# Patient Record
Sex: Male | Born: 1995 | Hispanic: Yes | Marital: Single | Smoking: Never smoker
Health system: Southern US, Community
[De-identification: ages and names within clinical notes are randomized; demographics above are authoritative.]

## PROBLEM LIST (undated history)

## (undated) DIAGNOSIS — J302 Other seasonal allergic rhinitis: Secondary | ICD-10-CM

---

## 2019-01-06 ENCOUNTER — Other Ambulatory Visit: Payer: Self-pay

## 2019-01-06 ENCOUNTER — Emergency Department
Admission: EM | Admit: 2019-01-06 | Discharge: 2019-01-06 | Disposition: A | Discharge status: Home / Self Care | Payer: Self-pay | Attending: Emergency Medicine | Admitting: Emergency Medicine

## 2019-01-06 ENCOUNTER — Emergency Department: Payer: Self-pay

## 2019-01-06 DIAGNOSIS — R079 Chest pain, unspecified: Secondary | ICD-10-CM | POA: Insufficient documentation

## 2019-01-06 DIAGNOSIS — R0789 Other chest pain: Secondary | ICD-10-CM

## 2019-01-06 HISTORY — DX: Other seasonal allergic rhinitis: J30.2

## 2019-01-06 LAB — BASIC METABOLIC PANEL
Anion gap: 8 (ref 5–15)
BUN: 16 mg/dL (ref 6–20)
CO2: 27 mmol/L (ref 22–32)
Calcium: 9.4 mg/dL (ref 8.9–10.3)
Chloride: 104 mmol/L (ref 98–111)
Creatinine, Ser: 0.97 mg/dL (ref 0.61–1.24)
GFR calc Af Amer: >60 mL/min (ref >60)
GFR calc non Af Amer: >60 mL/min (ref >60)
Glucose, Bld: 101 mg/dL — ABNORMAL HIGH (ref 70–99)
Potassium: 4.6 mmol/L (ref 3.5–5.1)
Sodium: 139 mmol/L (ref 135–145)

## 2019-01-06 LAB — CBC
HCT: 47.3 % (ref 39.0–52.0)
Hemoglobin: 16.1 g/dL (ref 13.0–17.0)
MCH: 30.8 pg (ref 26.0–34.0)
MCHC: 34 g/dL (ref 30.0–36.0)
MCV: 90.6 fL (ref 80.0–100.0)
Platelets: 176 K/uL (ref 150–400)
RBC: 5.22 MIL/uL (ref 4.22–5.81)
RDW: 12.4 % (ref 11.5–15.5)
WBC: 4.2 K/uL (ref 4.0–10.5)
nRBC: 0 % (ref 0.0–0.2)

## 2019-01-06 LAB — TROPONIN I (HIGH SENSITIVITY): Troponin I (High Sensitivity): 8 ng/L (ref <18)

## 2019-01-06 MED ORDER — IBUPROFEN 800 MG PO TABS: 800.0000 mg | ORAL_TABLET | Freq: Three times a day (TID) | ORAL | 0 refills | Status: AC | PRN

## 2019-01-06 NOTE — ED Notes (Signed)
Pt states that last night around 2330, he began to experience centralized chest pain that have not subsided since. Pt states that he was laying in bed when the chest pain began.  States that when he isn't moving the pain is a dull pressure and with activity the pain becomes sharp.

## 2019-01-06 NOTE — ED Provider Notes (Signed)
[facility] Emergency Department Provider Note       Time seen: ----------------------------------------- 11:05 AM on 01/06/2019 -----------------------------------------   I have reviewed the triage vital signs and the nursing notes.  HISTORY   Chief Complaint Chest Pain    HPI Steven Whitaker is a 23 y.o. male with a history of seasonal allergies who presents to the ED for centralized chest pain that started last night with intermittent periods of sharp chest pain.  He denies any shortness of breath.  He has sharp pain with exertion.  He denies fevers, chills or other complaints.  Past Medical History:  Diagnosis Date  . Seasonal allergies     There are no active problems to display for this patient.   History reviewed. No pertinent surgical history.  Allergies Coriander [coriandrum sativum]  Social History Social History   Tobacco Use  . Smoking status: Never Smoker  Substance Use Topics  . Alcohol use: Yes    Comment: occassionally  . Drug use: Never   Review of Systems Constitutional: Negative for fever. Cardiovascular: Positive for chest pain Respiratory: Negative for shortness of breath. Gastrointestinal: Negative for abdominal pain, vomiting and diarrhea. Musculoskeletal: Negative for back pain. Skin: Negative for rash. Neurological: Negative for headaches, focal weakness or numbness.  All systems negative/normal/unremarkable except as stated in the HPI  ____________________________________________   PHYSICAL EXAM:  VITAL SIGNS: ED Triage Vitals [01/06/19 1044]  Enc Vitals Group     BP (!) 107/59     Pulse Rate 73     Resp 16     Temp 98.2 F (36.8 C)     Temp Source Oral     SpO2 98 %     Weight 135 lb (61.2 kg)     Height 5\' 7"  (1.702 m)     Head Circumference      Peak Flow      Pain Score 1     Pain Loc      Pain Edu?      Excl. in GC?    Constitutional: Alert and oriented. Well appearing  and in no distress. Eyes: Conjunctivae are normal. Normal extraocular movements. Cardiovascular: Normal rate, regular rhythm. No murmurs, rubs, or gallops. Respiratory: Normal respiratory effort without tachypnea nor retractions. Breath sounds are clear and equal bilaterally. No wheezes/rales/rhonchi. Gastrointestinal: Soft and nontender. Normal bowel sounds Musculoskeletal: Nontender with normal range of motion in extremities. No lower extremity tenderness nor edema. Neurologic:  Normal speech and language. No gross focal neurologic deficits are appreciated.  Skin:  Skin is warm, dry and intact. No rash noted. Psychiatric: Mood and affect are normal. Speech and behavior are normal.  ____________________________________________  EKG: Interpreted by me.  Sinus rhythm with rate of 59 bpm, normal PR interval, normal QRS, normal QT  ____________________________________________  ED COURSE:  As part of my medical decision making, I reviewed the following data within the electronic MEDICAL RECORD NUMBER History obtained from family if available, nursing notes, old chart and ekg, as well as notes from prior ED visits. Patient presented for chest pain, we will assess with labs and imaging as indicated at this time.   Procedures  [facility] Steven Whitaker was evaluated in Emergency Department on 01/06/2019 for the symptoms described in the history of present illness. He was evaluated in the context of the global COVID-19 pandemic, which necessitated consideration that the patient might be at risk for infection with the SARS-CoV-2 virus that causes COVID-19. Institutional protocols and algorithms that pertain  to the evaluation of patients at risk for COVID-19 are in a state of rapid change based on information released by regulatory bodies including the CDC and federal and state organizations. These policies and algorithms were followed during the patient's care in the ED.   ____________________________________________   LABS (pertinent positives/negatives)  Labs Reviewed  BASIC METABOLIC PANEL  CBC  TROPONIN I (HIGH SENSITIVITY)    RADIOLOGY Images were viewed by me  Chest x-ray is unremarkable  ____________________________________________   DIFFERENTIAL DIAGNOSIS   Musculoskeletal pain, GERD, anxiety, pneumothorax, angina unlikely  FINAL ASSESSMENT AND PLAN  Chest pain   Plan: The patient had presented for chest pain which is likely musculoskeletal in origin. Patient's labs are reassuring. Patient's imaging did not reveal any acute process.  He be given Motrin and Valium to take as needed.   Laurence Aly, MD    Note: This note was generated in part or whole with voice recognition software. Voice recognition is usually quite accurate but there are transcription errors that can and very often do occur. I apologize for any typographical errors that were not detected and corrected.     Earleen Newport, MD 01/06/19 1106

## 2019-01-06 NOTE — ED Triage Notes (Signed)
Reports constant centralized chest pain that started last night and has had intermittent periods of sharp chest pain to center of chest. Denies SOB. Sharp pain with exertion. Pt alert and oriented X4, cooperative, RR even and unlabored, color WNL. Pt in NAD.

## 2020-09-21 IMAGING — CR DG CHEST 2V
2 series · 2 of 2 positions shown · non-contrast
Comparison: None.

CLINICAL DATA: Central chest pain beginning last night.

EXAM:
CHEST - 2 VIEW

[chest pa]
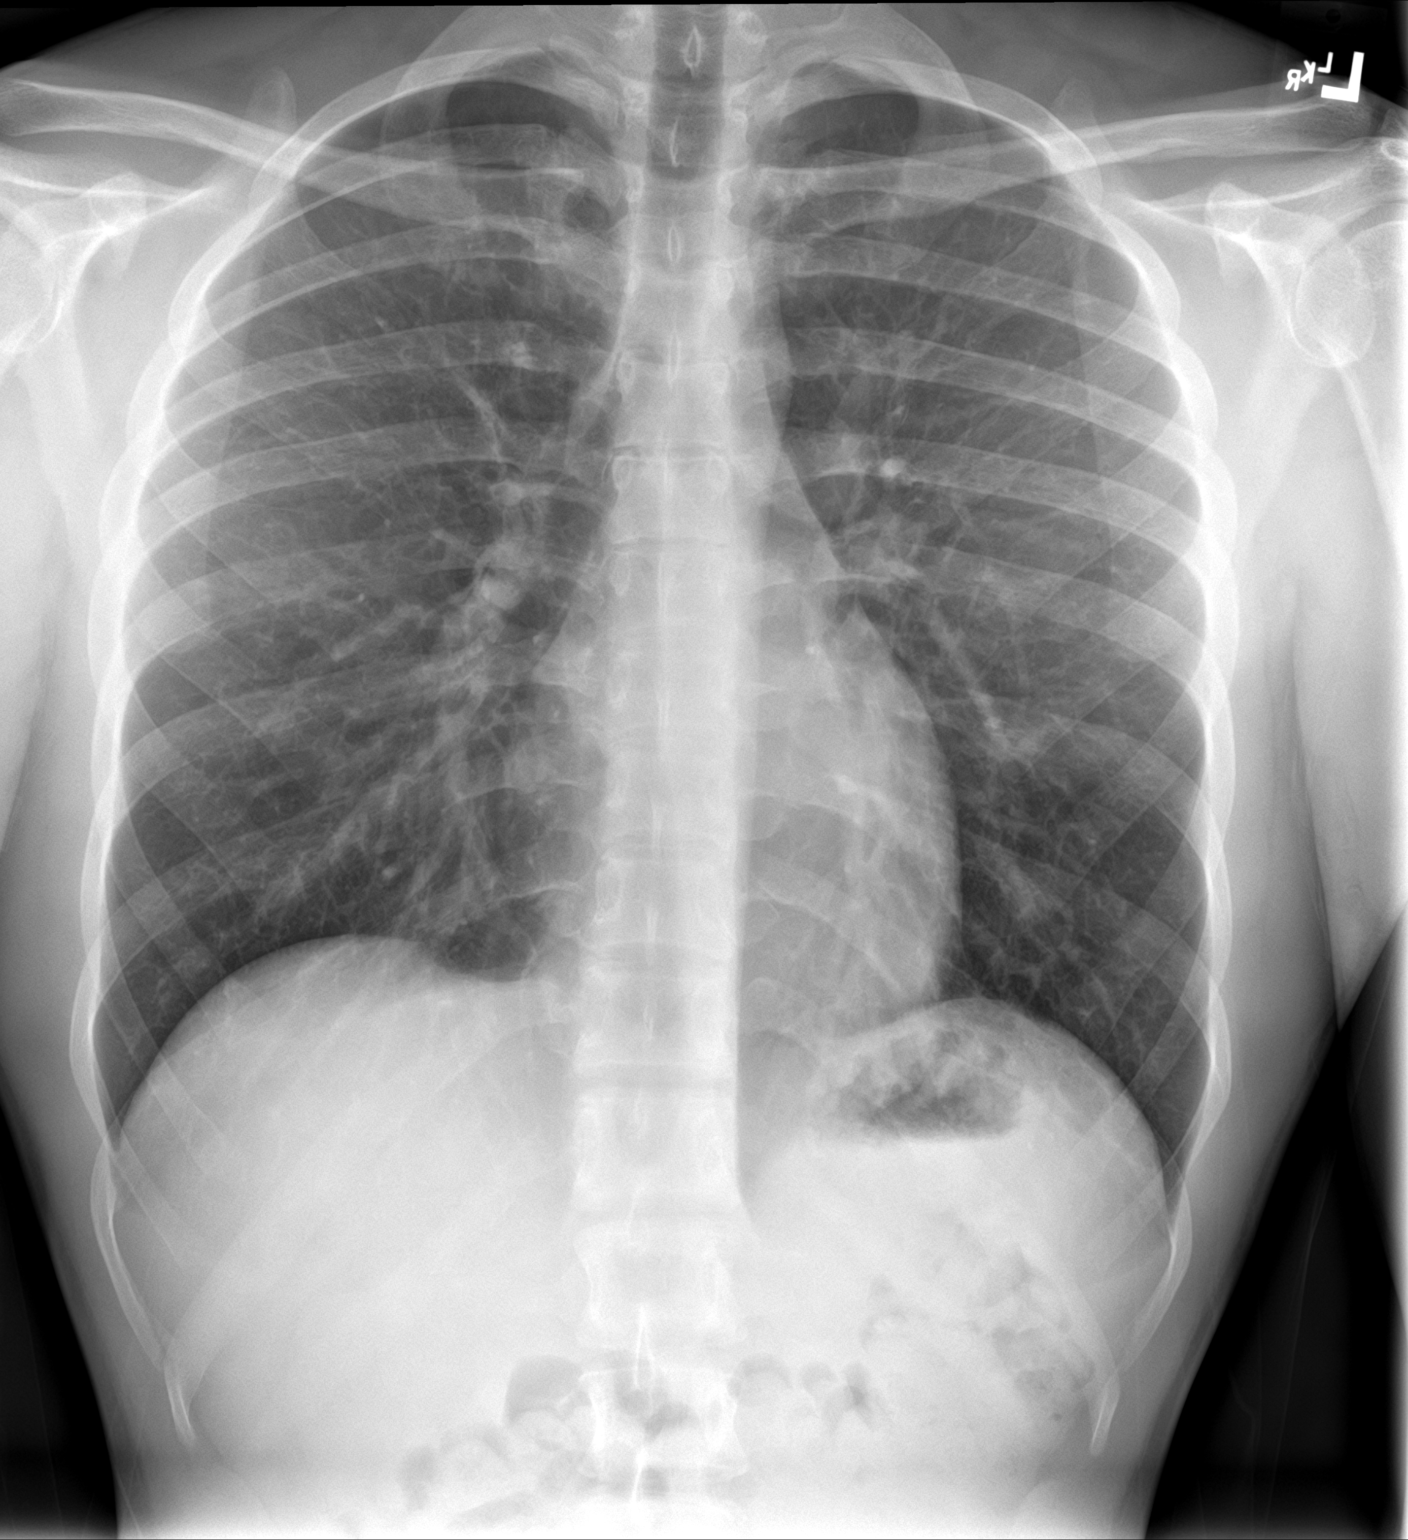

[chest lat]
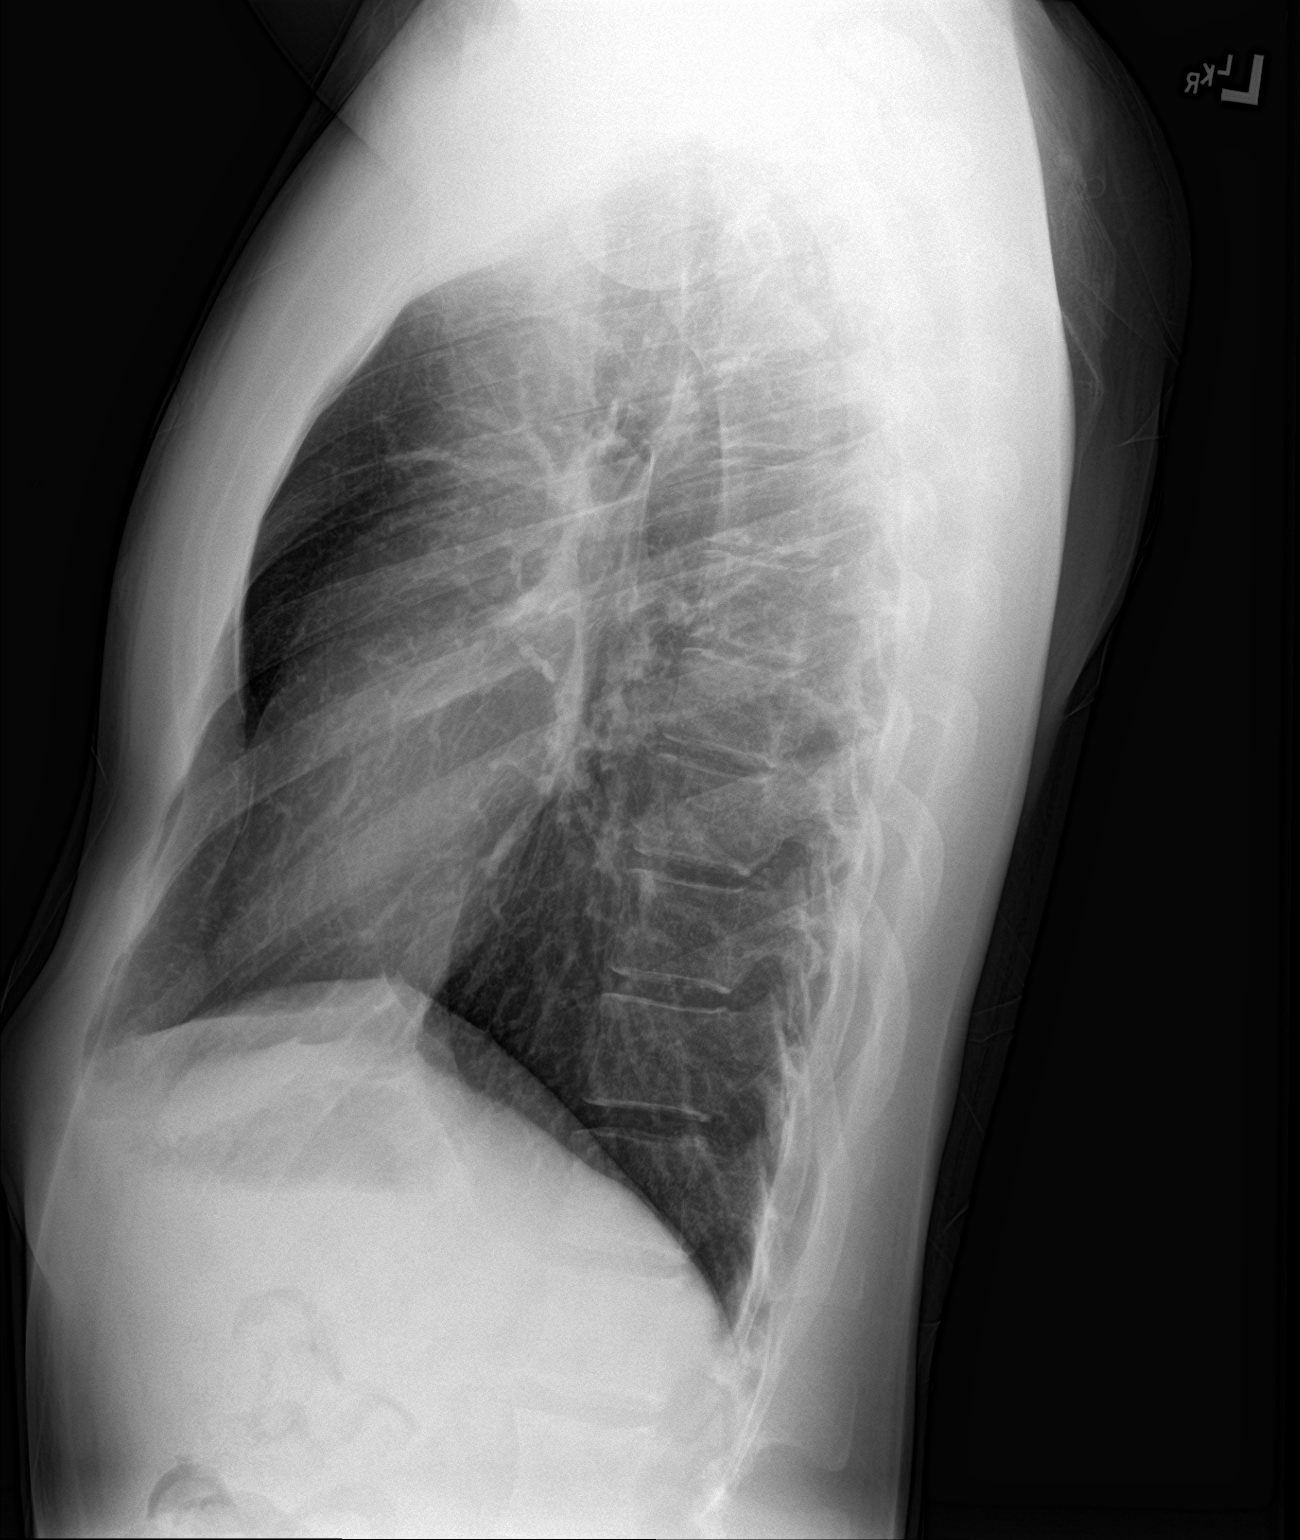

[2 of 2 positions shown; findings below may reference images not displayed]

FINDINGS: The cardiomediastinal silhouette is within normal limits. The lungs
are well inflated and clear. There is no evidence of pleural
effusion or pneumothorax. No acute osseous abnormality is
identified.
IMPRESSION: No active cardiopulmonary disease.
